# Patient Record
Sex: Male | Born: 1990 | Race: Black or African American | Hispanic: No | Marital: Single | Smoking: Current every day smoker
Health system: Southern US, Community
[De-identification: ages and names within clinical notes are randomized; demographics above are authoritative.]

## PROBLEM LIST (undated history)

## (undated) DIAGNOSIS — S2239XA Fracture of one rib, unspecified side, initial encounter for closed fracture: Secondary | ICD-10-CM

## (undated) DIAGNOSIS — J939 Pneumothorax, unspecified: Secondary | ICD-10-CM

---
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
## 2015-08-05 DIAGNOSIS — S2249XA Multiple fractures of ribs, unspecified side, initial encounter for closed fracture: Secondary | ICD-10-CM

## 2015-08-05 HISTORY — DX: Multiple fractures of ribs, unspecified side, initial encounter for closed fracture: S22.49XA

## 2015-08-28 ENCOUNTER — Emergency Department (HOSPITAL_COMMUNITY): Payer: Self-pay

## 2015-08-28 ENCOUNTER — Emergency Department (HOSPITAL_COMMUNITY): Payer: MEDICAID

## 2015-08-28 ENCOUNTER — Encounter (HOSPITAL_COMMUNITY): Payer: Self-pay | Admitting: Emergency Medicine

## 2015-08-28 ENCOUNTER — Observation Stay (HOSPITAL_COMMUNITY): Payer: Self-pay

## 2015-08-28 ENCOUNTER — Inpatient Hospital Stay (HOSPITAL_COMMUNITY)
Admission: EM | Admit: 2015-08-28 | Discharge: 2015-09-02 | DRG: 200 | Disposition: A | Discharge status: Home / Self Care | Payer: Self-pay | Attending: General Surgery | Admitting: General Surgery

## 2015-08-28 DIAGNOSIS — R402362 Coma scale, best motor response, obeys commands, at arrival to emergency department: Secondary | ICD-10-CM | POA: Diagnosis present

## 2015-08-28 DIAGNOSIS — R402142 Coma scale, eyes open, spontaneous, at arrival to emergency department: Secondary | ICD-10-CM | POA: Diagnosis present

## 2015-08-28 DIAGNOSIS — Z9689 Presence of other specified functional implants: Secondary | ICD-10-CM

## 2015-08-28 DIAGNOSIS — J939 Pneumothorax, unspecified: Secondary | ICD-10-CM

## 2015-08-28 DIAGNOSIS — S270XXA Traumatic pneumothorax, initial encounter: Principal | ICD-10-CM | POA: Diagnosis present

## 2015-08-28 DIAGNOSIS — S2249XA Multiple fractures of ribs, unspecified side, initial encounter for closed fracture: Secondary | ICD-10-CM | POA: Diagnosis present

## 2015-08-28 DIAGNOSIS — S53105A Unspecified dislocation of left ulnohumeral joint, initial encounter: Secondary | ICD-10-CM | POA: Diagnosis present

## 2015-08-28 DIAGNOSIS — R402252 Coma scale, best verbal response, oriented, at arrival to emergency department: Secondary | ICD-10-CM | POA: Diagnosis present

## 2015-08-28 DIAGNOSIS — F17210 Nicotine dependence, cigarettes, uncomplicated: Secondary | ICD-10-CM | POA: Diagnosis present

## 2015-08-28 DIAGNOSIS — S53125A Posterior dislocation of left ulnohumeral joint, initial encounter: Secondary | ICD-10-CM | POA: Diagnosis present

## 2015-08-28 DIAGNOSIS — J9811 Atelectasis: Secondary | ICD-10-CM | POA: Diagnosis present

## 2015-08-28 DIAGNOSIS — S2242XA Multiple fractures of ribs, left side, initial encounter for closed fracture: Secondary | ICD-10-CM | POA: Diagnosis present

## 2015-08-28 DIAGNOSIS — IMO0001 Reserved for inherently not codable concepts without codable children: Secondary | ICD-10-CM

## 2015-08-28 DIAGNOSIS — Z23 Encounter for immunization: Secondary | ICD-10-CM

## 2015-08-28 DIAGNOSIS — R413 Other amnesia: Secondary | ICD-10-CM | POA: Diagnosis present

## 2015-08-28 DIAGNOSIS — T148XXA Other injury of unspecified body region, initial encounter: Secondary | ICD-10-CM

## 2015-08-28 DIAGNOSIS — S272XXA Traumatic hemopneumothorax, initial encounter: Secondary | ICD-10-CM

## 2015-08-28 DIAGNOSIS — J9690 Respiratory failure, unspecified, unspecified whether with hypoxia or hypercapnia: Secondary | ICD-10-CM

## 2015-08-28 DIAGNOSIS — T1490XA Injury, unspecified, initial encounter: Secondary | ICD-10-CM

## 2015-08-28 HISTORY — DX: Fracture of one rib, unspecified side, initial encounter for closed fracture: S22.39XA

## 2015-08-28 HISTORY — DX: Pneumothorax, unspecified: J93.9

## 2015-08-28 LAB — CBC WITH DIFFERENTIAL/PLATELET
BASOS PCT: 0 %
Basophils Absolute: 0 10*3/uL (ref 0.0–0.1)
EOS ABS: 0.2 10*3/uL (ref 0.0–0.7)
EOS PCT: 1 %
HEMATOCRIT: 49.6 % (ref 39.0–52.0)
Hemoglobin: 16.5 g/dL (ref 13.0–17.0)
Lymphocytes Relative: 19 %
Lymphs Abs: 2.4 10*3/uL (ref 0.7–4.0)
MCH: 28.9 pg (ref 26.0–34.0)
MCHC: 33.3 g/dL (ref 30.0–36.0)
MCV: 87 fL (ref 78.0–100.0)
MONO ABS: 0.4 10*3/uL (ref 0.1–1.0)
MONOS PCT: 3 %
NEUTROS ABS: 10.2 10*3/uL — AB (ref 1.7–7.7)
Neutrophils Relative %: 77 %
PLATELETS: 176 10*3/uL (ref 150–400)
RBC: 5.7 MIL/uL (ref 4.22–5.81)
RDW: 12.9 % (ref 11.5–15.5)
WBC: 13.2 10*3/uL — ABNORMAL HIGH (ref 4.0–10.5)

## 2015-08-28 LAB — URINALYSIS, ROUTINE W REFLEX MICROSCOPIC
BILIRUBIN URINE: NEGATIVE
Glucose, UA: NEGATIVE mg/dL
HGB URINE DIPSTICK: NEGATIVE
Ketones, ur: NEGATIVE mg/dL
Leukocytes, UA: NEGATIVE
Nitrite: NEGATIVE
PH: 6.5 (ref 5.0–8.0)
Protein, ur: NEGATIVE mg/dL
SPECIFIC GRAVITY, URINE: 1.028 (ref 1.005–1.030)

## 2015-08-28 LAB — COMPREHENSIVE METABOLIC PANEL
ALBUMIN: 4.6 g/dL (ref 3.5–5.0)
ALT: 26 U/L (ref 17–63)
ANION GAP: 9 (ref 5–15)
AST: 40 U/L (ref 15–41)
Alkaline Phosphatase: 45 U/L (ref 38–126)
BILIRUBIN TOTAL: 1 mg/dL (ref 0.3–1.2)
BUN: 12 mg/dL (ref 6–20)
CO2: 26 mmol/L (ref 22–32)
Calcium: 9.7 mg/dL (ref 8.9–10.3)
Chloride: 106 mmol/L (ref 101–111)
Creatinine, Ser: 1.46 mg/dL — ABNORMAL HIGH (ref 0.61–1.24)
GFR calc Af Amer: >60 mL/min (ref >60)
GLUCOSE: 115 mg/dL — AB (ref 65–99)
POTASSIUM: 3.5 mmol/L (ref 3.5–5.1)
Sodium: 141 mmol/L (ref 135–145)
TOTAL PROTEIN: 7.6 g/dL (ref 6.5–8.1)

## 2015-08-28 LAB — RAPID URINE DRUG SCREEN, HOSP PERFORMED
AMPHETAMINES: NOT DETECTED
BENZODIAZEPINES: NOT DETECTED
Barbiturates: NOT DETECTED
Cocaine: NOT DETECTED
OPIATES: NOT DETECTED
TETRAHYDROCANNABINOL: POSITIVE — AB

## 2015-08-28 LAB — ETHANOL: Alcohol, Ethyl (B): 130 mg/dL — ABNORMAL HIGH (ref <5)

## 2015-08-28 MED ORDER — PANTOPRAZOLE SODIUM 40 MG PO TBEC
40.0000 mg | DELAYED_RELEASE_TABLET | Freq: Every day | ORAL | Status: DC
  Administered 2015-08-29 – 2015-08-31 (×3): 40 mg via ORAL
  Filled 2015-08-28 (×3): qty 1

## 2015-08-28 MED ORDER — FENTANYL CITRATE (PF) 100 MCG/2ML IJ SOLN
50.0000 ug | Freq: Once | INTRAMUSCULAR | Status: AC
  Administered 2015-08-28: 50 ug via INTRAVENOUS
  Filled 2015-08-28: qty 2

## 2015-08-28 MED ORDER — SODIUM CHLORIDE 0.9 % IV SOLN
INTRAVENOUS | Status: DC
  Administered 2015-08-28 – 2015-08-31 (×8): via INTRAVENOUS

## 2015-08-28 MED ORDER — SODIUM CHLORIDE 0.9 % IV BOLUS (SEPSIS)
1000.0000 mL | Freq: Once | INTRAVENOUS | Status: AC
  Administered 2015-08-28: 1000 mL via INTRAVENOUS

## 2015-08-28 MED ORDER — PROPOFOL 10 MG/ML IV BOLUS
0.5000 mg/kg | Freq: Once | INTRAVENOUS | Status: AC
  Administered 2015-08-28: 38.6 mg via INTRAVENOUS

## 2015-08-28 MED ORDER — BUPIVACAINE HCL 0.5 % IJ SOLN: 50.0000 mL | Freq: Once | INTRAMUSCULAR | Status: DC

## 2015-08-28 MED ORDER — BISACODYL 10 MG RE SUPP: 10.0000 mg | Freq: Every day | RECTAL | Status: DC | PRN

## 2015-08-28 MED ORDER — DOCUSATE SODIUM 100 MG PO CAPS
100.0000 mg | ORAL_CAPSULE | Freq: Two times a day (BID) | ORAL | Status: DC
  Administered 2015-08-28 – 2015-09-02 (×11): 100 mg via ORAL
  Filled 2015-08-28 (×11): qty 1

## 2015-08-28 MED ORDER — ENOXAPARIN SODIUM 40 MG/0.4ML ~~LOC~~ SOLN
40.0000 mg | SUBCUTANEOUS | Status: DC
  Administered 2015-08-28 – 2015-09-02 (×6): 40 mg via SUBCUTANEOUS
  Filled 2015-08-28 (×6): qty 0.4

## 2015-08-28 MED ORDER — PROPOFOL 10 MG/ML IV BOLUS
80.0000 mg | Freq: Once | INTRAVENOUS | Status: AC
  Administered 2015-08-28: 80 mg via INTRAVENOUS
  Filled 2015-08-28: qty 20

## 2015-08-28 MED ORDER — BUPIVACAINE HCL (PF) 0.5 % IJ SOLN
10.0000 mL | Freq: Once | INTRAMUSCULAR | Status: AC
  Administered 2015-08-28: 10 mL
  Filled 2015-08-28: qty 10

## 2015-08-28 MED ORDER — PANTOPRAZOLE SODIUM 40 MG IV SOLR
40.0000 mg | Freq: Every day | INTRAVENOUS | Status: DC
  Administered 2015-08-28: 40 mg via INTRAVENOUS
  Filled 2015-08-28: qty 40

## 2015-08-28 MED ORDER — HYDROMORPHONE HCL 1 MG/ML IJ SOLN
0.5000 mg | INTRAMUSCULAR | Status: DC | PRN
  Administered 2015-08-28 – 2015-09-01 (×11): 1 mg via INTRAVENOUS
  Filled 2015-08-28 (×11): qty 1

## 2015-08-28 MED ORDER — TETANUS-DIPHTH-ACELL PERTUSSIS 5-2.5-18.5 LF-MCG/0.5 IM SUSP
0.5000 mL | Freq: Once | INTRAMUSCULAR | Status: AC
  Administered 2015-08-28: 0.5 mL via INTRAMUSCULAR
  Filled 2015-08-28: qty 0.5

## 2015-08-28 MED ORDER — ONDANSETRON HCL 4 MG/2ML IJ SOLN: 4.0000 mg | Freq: Four times a day (QID) | INTRAMUSCULAR | Status: DC | PRN

## 2015-08-28 MED ORDER — OXYCODONE HCL 5 MG PO TABS
10.0000 mg | ORAL_TABLET | ORAL | Status: DC | PRN
  Administered 2015-08-28 – 2015-08-29 (×4): 10 mg via ORAL
  Filled 2015-08-28 (×4): qty 2

## 2015-08-28 MED ORDER — PROPOFOL 10 MG/ML IV BOLUS
0.5000 mg/kg | Freq: Once | INTRAVENOUS | Status: AC
  Administered 2015-08-28: 38.6 mg via INTRAVENOUS
  Filled 2015-08-28: qty 20

## 2015-08-28 MED ORDER — ONDANSETRON HCL 4 MG PO TABS: 4.0000 mg | ORAL_TABLET | Freq: Four times a day (QID) | ORAL | Status: DC | PRN

## 2015-08-28 MED ORDER — IOHEXOL 300 MG/ML  SOLN
100.0000 mL | Freq: Once | INTRAMUSCULAR | Status: AC | PRN
  Administered 2015-08-28: 100 mL via INTRAVENOUS

## 2015-08-28 MED ORDER — HYDROMORPHONE HCL 1 MG/ML IJ SOLN
2.0000 mg | INTRAMUSCULAR | Status: DC | PRN
  Administered 2015-08-30 – 2015-09-01 (×8): 2 mg via INTRAVENOUS
  Filled 2015-08-28 (×7): qty 2
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
## 2015-08-28 NOTE — ED Notes (Signed)
Attempted to call report

## 2015-08-28 NOTE — ED Notes (Signed)
Administered fentanyl 50 mcg in CT to scan when pt returns to room.
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
## 2015-08-28 NOTE — ED Notes (Signed)
Dr. Magnus IvanBlackman at bedside, given marcaine for injection.

## 2015-08-28 NOTE — ED Notes (Signed)
Patient transported to X-ray 
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
## 2015-08-28 NOTE — ED Notes (Signed)
Xray notified pt is ready 
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
## 2015-08-29 ENCOUNTER — Inpatient Hospital Stay (HOSPITAL_COMMUNITY): Payer: Self-pay

## 2015-08-29 LAB — BASIC METABOLIC PANEL
ANION GAP: 10 (ref 5–15)
BUN: 12 mg/dL (ref 6–20)
CO2: 22 mmol/L (ref 22–32)
Calcium: 9 mg/dL (ref 8.9–10.3)
Chloride: 105 mmol/L (ref 101–111)
Creatinine, Ser: 1.21 mg/dL (ref 0.61–1.24)
GFR calc Af Amer: >60 mL/min (ref >60)
GLUCOSE: 79 mg/dL (ref 65–99)
POTASSIUM: 4.2 mmol/L (ref 3.5–5.1)
Sodium: 137 mmol/L (ref 135–145)

## 2015-08-29 LAB — CBC
HEMATOCRIT: 45.4 % (ref 39.0–52.0)
Hemoglobin: 15 g/dL (ref 13.0–17.0)
MCH: 28.8 pg (ref 26.0–34.0)
MCHC: 33 g/dL (ref 30.0–36.0)
MCV: 87.3 fL (ref 78.0–100.0)
PLATELETS: 146 10*3/uL — AB (ref 150–400)
RBC: 5.2 MIL/uL (ref 4.22–5.81)
RDW: 12.8 % (ref 11.5–15.5)
WBC: 10.8 10*3/uL — AB (ref 4.0–10.5)

## 2015-08-29 MED ORDER — IPRATROPIUM-ALBUTEROL 0.5-2.5 (3) MG/3ML IN SOLN: 3.0000 mL | Freq: Four times a day (QID) | RESPIRATORY_TRACT | Status: DC | PRN

## 2015-08-29 MED ORDER — KETOROLAC TROMETHAMINE 15 MG/ML IJ SOLN
15.0000 mg | Freq: Three times a day (TID) | INTRAMUSCULAR | Status: AC
Start: 2015-08-29 — End: 2015-08-31
  Administered 2015-08-29 – 2015-08-31 (×6): 15 mg via INTRAVENOUS
  Filled 2015-08-29 (×6): qty 1

## 2015-08-29 MED ORDER — ACETAMINOPHEN 500 MG PO TABS
1000.0000 mg | ORAL_TABLET | Freq: Four times a day (QID) | ORAL | Status: DC
  Administered 2015-08-29 – 2015-09-01 (×12): 1000 mg via ORAL
  Filled 2015-08-29 (×12): qty 2

## 2015-08-29 MED ORDER — INFLUENZA VAC SPLIT QUAD 0.5 ML IM SUSY
0.5000 mL | PREFILLED_SYRINGE | INTRAMUSCULAR | Status: DC
  Filled 2015-08-29: qty 0.5

## 2015-08-29 MED ORDER — OXYCODONE HCL 5 MG PO TABS
10.0000 mg | ORAL_TABLET | ORAL | Status: DC | PRN
  Administered 2015-08-29 – 2015-09-01 (×10): 15 mg via ORAL
  Filled 2015-08-29 (×11): qty 3
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
## 2015-08-30 ENCOUNTER — Inpatient Hospital Stay (HOSPITAL_COMMUNITY): Payer: Self-pay

## 2015-08-30 DIAGNOSIS — J939 Pneumothorax, unspecified: Secondary | ICD-10-CM

## 2015-08-30 HISTORY — DX: Pneumothorax, unspecified: J93.9

## 2015-08-30 HISTORY — PX: CHEST TUBE INSERTION: SHX231

## 2015-08-30 LAB — BASIC METABOLIC PANEL
Anion gap: 5 (ref 5–15)
BUN: 6 mg/dL (ref 6–20)
CALCIUM: 8.6 mg/dL — AB (ref 8.9–10.3)
CO2: 28 mmol/L (ref 22–32)
CREATININE: 1.11 mg/dL (ref 0.61–1.24)
Chloride: 106 mmol/L (ref 101–111)
GFR calc non Af Amer: >60 mL/min (ref >60)
Glucose, Bld: 86 mg/dL (ref 65–99)
Potassium: 3.3 mmol/L — ABNORMAL LOW (ref 3.5–5.1)
Sodium: 139 mmol/L (ref 135–145)

## 2015-08-30 LAB — CBC
HCT: 39.1 % (ref 39.0–52.0)
Hemoglobin: 12.8 g/dL — ABNORMAL LOW (ref 13.0–17.0)
MCH: 28.4 pg (ref 26.0–34.0)
MCHC: 32.7 g/dL (ref 30.0–36.0)
MCV: 86.7 fL (ref 78.0–100.0)
PLATELETS: 136 10*3/uL — AB (ref 150–400)
RBC: 4.51 MIL/uL (ref 4.22–5.81)
RDW: 12.7 % (ref 11.5–15.5)
WBC: 9.4 10*3/uL (ref 4.0–10.5)

## 2015-08-30 MED ORDER — DIPHENHYDRAMINE HCL 25 MG PO CAPS
25.0000 mg | ORAL_CAPSULE | Freq: Four times a day (QID) | ORAL | Status: DC | PRN
  Administered 2015-08-30 – 2015-09-02 (×6): 25 mg via ORAL
  Filled 2015-08-30 (×5): qty 1

## 2015-08-30 MED ORDER — LIDOCAINE-EPINEPHRINE 2 %-1:100000 IJ SOLN
20.0000 mL | Freq: Once | INTRAMUSCULAR | Status: DC
  Filled 2015-08-30: qty 20
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
## 2015-08-30 NOTE — Brief Op Note (Signed)
*   No surgery found *  11:55 AM  PATIENT:  Geoffrey Santos  24 y.o. male  PRE-OPERATIVE DIAGNOSIS:  Left pneumothorax  POST-OPERATIVE DIAGNOSIS:  Left pneumothorax  PROCEDURE:  Left chest tube  SURGEON:  Toth  PHYSICIAN ASSISTANT:   ASSISTANTS: none   ANESTHESIA:   local  EBL:     BLOOD ADMINISTERED:none  DRAINS: 28 french Chest Tube(s) in the left pleural space   LOCAL MEDICATIONS USED:  LIDOCAINE   SPECIMEN:  No Specimen  DISPOSITION OF SPECIMEN:  N/A  COUNTS:  YES  TOURNIQUET:  * No surgery found *  DICTATION: .Dragon Dictation   Chest Tube Insertion Procedure Note  Indications:  Clinically significant Pneumothorax  Pre-operative Diagnosis: Pneumothorax  Post-operative Diagnosis: Pneumothorax  Procedure Details  Informed consent was obtained for the procedure, including sedation.  Risks of lung perforation, hemorrhage, arrhythmia, and adverse drug reaction were discussed.   After sterile skin prep, using standard technique, a 28 French tube was placed in the left anterior 4th rib space.  Findings: 250 ml of blood fluid obtained   Estimated Blood Loss:  200 mL         Specimens:  None              Complications:  None; patient tolerated the procedure well.         Disposition: in room         Condition: stable  Attending Attestation: I was present and scrubbed for the entire procedure.  PLAN OF CARE: Admit to inpatient   PATIENT DISPOSITION:  in room   Delay start of Pharmacological VTE agent (>24hrs) due to surgical blood loss or risk of bleeding: no

## 2015-08-31 ENCOUNTER — Inpatient Hospital Stay (HOSPITAL_COMMUNITY): Payer: Self-pay
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
## 2015-09-01 ENCOUNTER — Inpatient Hospital Stay (HOSPITAL_COMMUNITY): Payer: Self-pay

## 2015-09-01 DIAGNOSIS — S53105A Unspecified dislocation of left ulnohumeral joint, initial encounter: Secondary | ICD-10-CM | POA: Diagnosis present

## 2015-09-01 DIAGNOSIS — S272XXA Traumatic hemopneumothorax, initial encounter: Secondary | ICD-10-CM | POA: Diagnosis present

## 2015-09-01 DIAGNOSIS — F1910 Other psychoactive substance abuse, uncomplicated: Secondary | ICD-10-CM | POA: Insufficient documentation

## 2015-09-01 MED ORDER — KETOROLAC TROMETHAMINE 30 MG/ML IJ SOLN
30.0000 mg | Freq: Once | INTRAMUSCULAR | Status: AC
  Administered 2015-09-01: 30 mg via INTRAVENOUS
  Filled 2015-09-01: qty 1

## 2015-09-01 MED ORDER — POLYETHYLENE GLYCOL 3350 17 G PO PACK
17.0000 g | PACK | Freq: Every day | ORAL | Status: DC
  Administered 2015-09-01 – 2015-09-02 (×2): 17 g via ORAL
  Filled 2015-09-01 (×2): qty 1

## 2015-09-01 MED ORDER — TRAMADOL HCL 50 MG PO TABS
100.0000 mg | ORAL_TABLET | Freq: Four times a day (QID) | ORAL | Status: DC
  Administered 2015-09-01 – 2015-09-02 (×5): 100 mg via ORAL
  Filled 2015-09-01 (×5): qty 2

## 2015-09-01 MED ORDER — HYDROMORPHONE HCL 1 MG/ML IJ SOLN
0.5000 mg | INTRAMUSCULAR | Status: DC | PRN
  Administered 2015-09-01 – 2015-09-02 (×3): 0.5 mg via INTRAVENOUS
  Filled 2015-09-01 (×3): qty 1

## 2015-09-01 MED ORDER — OXYCODONE HCL 5 MG PO TABS
10.0000 mg | ORAL_TABLET | ORAL | Status: DC | PRN
  Administered 2015-09-01 (×2): 20 mg via ORAL
  Administered 2015-09-01: 15 mg via ORAL
  Administered 2015-09-02 (×2): 20 mg via ORAL
  Filled 2015-09-01 (×5): qty 4

## 2015-09-01 NOTE — Progress Notes (Signed)
PT Discharge Note  Patient Details Name: Geoffrey Santos MRN: [id number] DOB: 07/01/1991   Cancelled Treatment:    Reason Eval/Treat Not Completed: PT screened, no needs identified, will sign off.  Pt mobilizing independently.  Girlfriend assisting carrying the "chest tube".  No further needs as doesn't need L UE movement at this time.  09/01/2015  [place] BingKen Elon Eoff, PT [phone]  (pager)  Meera Vasco, Eliseo GumKenneth V 09/01/2015, 10:44 AM
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
## 2015-09-02 ENCOUNTER — Inpatient Hospital Stay (HOSPITAL_COMMUNITY): Payer: Self-pay

## 2015-09-02 ENCOUNTER — Encounter (HOSPITAL_COMMUNITY): Payer: Self-pay | Admitting: General Practice

## 2015-09-02 MED ORDER — TRAMADOL HCL 50 MG PO TABS: 100.0000 mg | ORAL_TABLET | Freq: Four times a day (QID) | ORAL | Status: AC

## 2015-09-02 MED ORDER — OXYCODONE-ACETAMINOPHEN 10-325 MG PO TABS: 1.0000 | ORAL_TABLET | ORAL | Status: AC | PRN

## 2015-09-02 MED ORDER — NAPROXEN 500 MG PO TABS: 500.0000 mg | ORAL_TABLET | Freq: Two times a day (BID) | ORAL | Status: AC
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
## 2015-09-02 NOTE — Progress Notes (Signed)
OT Cancellation Note and Discharge  Patient Details Name: Geoffrey Santos MRN: [id number] DOB: 05/13/1991   Cancelled Treatment:    Reason Eval/Treat Not Completed: Other (comment). Noted pt to D/C today, called the floor and spoke with pt's nurse Mardella Layman(Lindsey) who states pt has been up and walking around unit by himself and that between he and his girlfriend they have basic ADLs covered and she Mardella Layman(Lindsey) will make sure he is aware that he needs to keep arm elevated and keep his fingers and shoulder moving throughout the day. No acute OT needs identified, we will sign off.  Evette GeorgesLeonard, Chistina Roston Eva [phone] 09/02/2015, 1:18 PM

## 2015-09-02 NOTE — Discharge Instructions (Signed)
Remove chest dressing on Thursday, then Wash wounds daily in shower with soap and water. Do not soak. Apply antibiotic ointment (e.g. Neosporin) twice daily and as needed to keep moist. Cover with dry dressing.  No driving while taking oxycodone.  Keep elbow splint on and dry.

## 2015-09-02 NOTE — Discharge Planning (Signed)
Patient discharged home in stable condition. Verbalizes understanding of all discharge instructions, including home medications and follow up appointments. 
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
## 2017-10-14 ENCOUNTER — Other Ambulatory Visit: Payer: Self-pay

## 2017-10-14 ENCOUNTER — Encounter: Payer: Self-pay | Admitting: *Deleted

## 2017-10-14 ENCOUNTER — Emergency Department
Admission: EM | Admit: 2017-10-14 | Discharge: 2017-10-14 | Disposition: A | Discharge status: Home / Self Care | Payer: Self-pay | Attending: Emergency Medicine | Admitting: Emergency Medicine

## 2017-10-14 DIAGNOSIS — S80812A Abrasion, left lower leg, initial encounter: Secondary | ICD-10-CM | POA: Insufficient documentation

## 2017-10-14 DIAGNOSIS — Z203 Contact with and (suspected) exposure to rabies: Secondary | ICD-10-CM | POA: Insufficient documentation

## 2017-10-14 DIAGNOSIS — Z91013 Allergy to seafood: Secondary | ICD-10-CM | POA: Insufficient documentation

## 2017-10-14 DIAGNOSIS — W540XXA Bitten by dog, initial encounter: Secondary | ICD-10-CM | POA: Insufficient documentation

## 2017-10-14 DIAGNOSIS — Z23 Encounter for immunization: Secondary | ICD-10-CM | POA: Insufficient documentation

## 2017-10-14 DIAGNOSIS — Y999 Unspecified external cause status: Secondary | ICD-10-CM | POA: Insufficient documentation

## 2017-10-14 DIAGNOSIS — Y939 Activity, unspecified: Secondary | ICD-10-CM | POA: Insufficient documentation

## 2017-10-14 DIAGNOSIS — Z2914 Encounter for prophylactic rabies immune globin: Secondary | ICD-10-CM | POA: Insufficient documentation

## 2017-10-14 DIAGNOSIS — Y9289 Other specified places as the place of occurrence of the external cause: Secondary | ICD-10-CM | POA: Insufficient documentation

## 2017-10-14 DIAGNOSIS — F17210 Nicotine dependence, cigarettes, uncomplicated: Secondary | ICD-10-CM | POA: Insufficient documentation

## 2017-10-14 DIAGNOSIS — Z79899 Other long term (current) drug therapy: Secondary | ICD-10-CM | POA: Insufficient documentation

## 2017-10-14 MED ORDER — AMOXICILLIN-POT CLAVULANATE 875-125 MG PO TABS
1.0000 | ORAL_TABLET | Freq: Once | ORAL | Status: AC
  Administered 2017-10-14: 1 via ORAL
  Filled 2017-10-14: qty 1

## 2017-10-14 MED ORDER — TETANUS-DIPHTH-ACELL PERTUSSIS 5-2.5-18.5 LF-MCG/0.5 IM SUSP
0.5000 mL | Freq: Once | INTRAMUSCULAR | Status: AC
  Administered 2017-10-14: 0.5 mL via INTRAMUSCULAR
  Filled 2017-10-14: qty 0.5

## 2017-10-14 MED ORDER — RABIES VACCINE, PCEC IM SUSR
1.0000 mL | Freq: Once | INTRAMUSCULAR | Status: AC
  Administered 2017-10-14: 1 mL via INTRAMUSCULAR
  Filled 2017-10-14: qty 1

## 2017-10-14 MED ORDER — AMOXICILLIN-POT CLAVULANATE 875-125 MG PO TABS: 1.0000 | ORAL_TABLET | Freq: Two times a day (BID) | ORAL | 0 refills | Status: AC

## 2017-10-14 MED ORDER — ACETAMINOPHEN 500 MG PO TABS
1000.0000 mg | ORAL_TABLET | Freq: Once | ORAL | Status: AC
  Administered 2017-10-14: 1000 mg via ORAL
  Filled 2017-10-14: qty 2

## 2017-10-14 MED ORDER — IBUPROFEN 800 MG PO TABS: 800.0000 mg | ORAL_TABLET | Freq: Three times a day (TID) | ORAL | 0 refills | Status: AC | PRN

## 2017-10-14 MED ORDER — RABIES IMMUNE GLOBULIN 150 UNIT/ML IM INJ
20.0000 [IU]/kg | INJECTION | Freq: Once | INTRAMUSCULAR | Status: AC
  Administered 2017-10-14: 1350 [IU] via INTRAMUSCULAR
  Filled 2017-10-14: qty 9

## 2017-10-14 NOTE — Discharge Instructions (Signed)
Please keep wounds clean and covered.  Take antibiotics as prescribed.  Please follow-up on January 14, January 18, January 25 for the remainder of your rabies vaccinations.  Return to the emergency department immediately for any increase in calf pain, swelling, redness or fevers.

## 2017-10-14 NOTE — ED Triage Notes (Signed)
Pt to ED reporting he was bitten by and unknown dog. Multiple puncture wounds noted to left calf. Bleeding is under control at this time. Pt reports he passed out after the bite occurred and reports 1 episode of vomiting.

## 2017-10-14 NOTE — ED Notes (Signed)
[place] PD talking to pt at this time. Bite report completed.
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
## 2017-10-14 NOTE — ED Notes (Signed)
Pt reports understanding of follow up and home care. Pt in NAD at this time. Pt taken to car via wheelchair. NAD at this time.

## 2018-10-31 ENCOUNTER — Other Ambulatory Visit: Payer: Self-pay

## 2018-10-31 ENCOUNTER — Encounter: Payer: Self-pay | Admitting: Emergency Medicine

## 2018-10-31 ENCOUNTER — Emergency Department
Admission: EM | Admit: 2018-10-31 | Discharge: 2018-11-01 | Disposition: A | Discharge status: Home / Self Care | Payer: Self-pay | Attending: Emergency Medicine | Admitting: Emergency Medicine

## 2018-10-31 DIAGNOSIS — F10929 Alcohol use, unspecified with intoxication, unspecified: Secondary | ICD-10-CM | POA: Insufficient documentation

## 2018-10-31 DIAGNOSIS — F17210 Nicotine dependence, cigarettes, uncomplicated: Secondary | ICD-10-CM | POA: Insufficient documentation

## 2018-10-31 DIAGNOSIS — F10920 Alcohol use, unspecified with intoxication, uncomplicated: Secondary | ICD-10-CM

## 2018-10-31 DIAGNOSIS — Z0489 Encounter for examination and observation for other specified reasons: Secondary | ICD-10-CM | POA: Diagnosis present

## 2018-10-31 LAB — URINE DRUG SCREEN, QUALITATIVE (ARMC ONLY)
Amphetamines, Ur Screen: NOT DETECTED
Barbiturates, Ur Screen: NOT DETECTED
Benzodiazepine, Ur Scrn: NOT DETECTED
Cannabinoid 50 Ng, Ur ~~LOC~~: POSITIVE — AB
Cocaine Metabolite,Ur ~~LOC~~: NOT DETECTED
MDMA (Ecstasy)Ur Screen: NOT DETECTED
Methadone Scn, Ur: NOT DETECTED
OPIATE, UR SCREEN: NOT DETECTED
Phencyclidine (PCP) Ur S: NOT DETECTED
Tricyclic, Ur Screen: NOT DETECTED

## 2018-10-31 LAB — COMPREHENSIVE METABOLIC PANEL
ALT: 42 U/L (ref 0–44)
AST: 56 U/L — ABNORMAL HIGH (ref 15–41)
Albumin: 5.1 g/dL — ABNORMAL HIGH (ref 3.5–5.0)
Alkaline Phosphatase: 41 U/L (ref 38–126)
Anion gap: 9 (ref 5–15)
BUN: 8 mg/dL (ref 6–20)
CHLORIDE: 107 mmol/L (ref 98–111)
CO2: 26 mmol/L (ref 22–32)
Calcium: 9.4 mg/dL (ref 8.9–10.3)
Creatinine, Ser: 1.23 mg/dL (ref 0.61–1.24)
GFR calc Af Amer: >60 mL/min (ref >60)
GFR calc non Af Amer: >60 mL/min (ref >60)
Glucose, Bld: 105 mg/dL — ABNORMAL HIGH (ref 70–99)
POTASSIUM: 3.1 mmol/L — AB (ref 3.5–5.1)
SODIUM: 142 mmol/L (ref 135–145)
Total Bilirubin: 1 mg/dL (ref 0.3–1.2)
Total Protein: 7.9 g/dL (ref 6.5–8.1)

## 2018-10-31 LAB — CBC
HEMATOCRIT: 50.2 % (ref 39.0–52.0)
HEMOGLOBIN: 16 g/dL (ref 13.0–17.0)
MCH: 28 pg (ref 26.0–34.0)
MCHC: 31.9 g/dL (ref 30.0–36.0)
MCV: 87.8 fL (ref 80.0–100.0)
Platelets: 158 10*3/uL (ref 150–400)
RBC: 5.72 MIL/uL (ref 4.22–5.81)
RDW: 12.2 % (ref 11.5–15.5)
WBC: 11 10*3/uL — ABNORMAL HIGH (ref 4.0–10.5)
nRBC: 0 % (ref 0.0–0.2)

## 2018-10-31 LAB — ETHANOL: Alcohol, Ethyl (B): 304 mg/dL (ref <10)
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
## 2018-10-31 NOTE — ED Triage Notes (Signed)
Pt arrived to the ED under custody from [facility] to be medically cleared to go to jail. The Pt has been been drinking and allegedly took some unknown pills. Pt is AOx4 in no apparent distress.
